# Patient Record
Sex: Female | Born: 1960 | Race: Black or African American | Hispanic: No | Smoking: Never smoker
Health system: Southern US, Community
[De-identification: ages and names within clinical notes are randomized; demographics above are authoritative.]

## PROBLEM LIST (undated history)

## (undated) DIAGNOSIS — R809 Proteinuria, unspecified: Secondary | ICD-10-CM

## (undated) DIAGNOSIS — H35039 Hypertensive retinopathy, unspecified eye: Secondary | ICD-10-CM

## (undated) DIAGNOSIS — H9313 Tinnitus, bilateral: Secondary | ICD-10-CM

## (undated) DIAGNOSIS — E1121 Type 2 diabetes mellitus with diabetic nephropathy: Secondary | ICD-10-CM

## (undated) DIAGNOSIS — J302 Other seasonal allergic rhinitis: Secondary | ICD-10-CM

## (undated) DIAGNOSIS — E119 Type 2 diabetes mellitus without complications: Secondary | ICD-10-CM

## (undated) DIAGNOSIS — K219 Gastro-esophageal reflux disease without esophagitis: Secondary | ICD-10-CM

## (undated) DIAGNOSIS — K279 Peptic ulcer, site unspecified, unspecified as acute or chronic, without hemorrhage or perforation: Secondary | ICD-10-CM

## (undated) DIAGNOSIS — E559 Vitamin D deficiency, unspecified: Secondary | ICD-10-CM

## (undated) DIAGNOSIS — B009 Herpesviral infection, unspecified: Secondary | ICD-10-CM

## (undated) HISTORY — DX: Gastro-esophageal reflux disease without esophagitis: K21.9

## (undated) HISTORY — DX: Herpesviral infection, unspecified: B00.9

## (undated) HISTORY — DX: Hypertensive retinopathy, unspecified eye: H35.039

## (undated) HISTORY — DX: Type 2 diabetes mellitus without complications: E11.9

## (undated) HISTORY — DX: Type 2 diabetes mellitus with diabetic nephropathy: E11.21

## (undated) HISTORY — DX: Peptic ulcer, site unspecified, unspecified as acute or chronic, without hemorrhage or perforation: K27.9

## (undated) HISTORY — DX: Vitamin D deficiency, unspecified: E55.9

## (undated) HISTORY — DX: Other seasonal allergic rhinitis: J30.2

## (undated) HISTORY — DX: Proteinuria, unspecified: R80.9

## (undated) HISTORY — DX: Tinnitus, bilateral: H93.13

---

## 1998-08-13 ENCOUNTER — Ambulatory Visit (HOSPITAL_COMMUNITY): Admission: RE | Admit: 1998-08-13 | Discharge: 1998-08-13 | Payer: Self-pay

## 1999-11-30 ENCOUNTER — Encounter: Admission: RE | Admit: 1999-11-30 | Discharge: 2000-02-28 | Payer: Self-pay | Admitting: Family Medicine

## 2000-09-13 ENCOUNTER — Other Ambulatory Visit: Admission: RE | Admit: 2000-09-13 | Discharge: 2000-09-13 | Payer: Self-pay | Admitting: Obstetrics and Gynecology

## 2001-01-17 ENCOUNTER — Other Ambulatory Visit: Admission: RE | Admit: 2001-01-17 | Discharge: 2001-01-17 | Payer: Self-pay | Admitting: Obstetrics and Gynecology

## 2001-02-06 ENCOUNTER — Ambulatory Visit (HOSPITAL_COMMUNITY): Admission: RE | Admit: 2001-02-06 | Discharge: 2001-02-06 | Payer: Self-pay | Admitting: Obstetrics and Gynecology

## 2001-02-06 ENCOUNTER — Encounter: Payer: Self-pay | Admitting: Obstetrics and Gynecology

## 2001-03-16 ENCOUNTER — Ambulatory Visit (HOSPITAL_COMMUNITY): Admission: RE | Admit: 2001-03-16 | Discharge: 2001-03-16 | Payer: Self-pay | Admitting: Obstetrics and Gynecology

## 2002-08-07 ENCOUNTER — Other Ambulatory Visit: Admission: RE | Admit: 2002-08-07 | Discharge: 2002-08-07 | Payer: Self-pay | Admitting: Obstetrics and Gynecology

## 2002-11-29 ENCOUNTER — Encounter: Payer: Self-pay | Admitting: Obstetrics and Gynecology

## 2002-11-29 ENCOUNTER — Ambulatory Visit (HOSPITAL_COMMUNITY): Admission: RE | Admit: 2002-11-29 | Discharge: 2002-11-29 | Payer: Self-pay | Admitting: Obstetrics and Gynecology

## 2004-03-15 ENCOUNTER — Other Ambulatory Visit: Admission: RE | Admit: 2004-03-15 | Discharge: 2004-03-15 | Payer: Self-pay | Admitting: Obstetrics and Gynecology

## 2004-03-17 ENCOUNTER — Ambulatory Visit (HOSPITAL_COMMUNITY): Admission: RE | Admit: 2004-03-17 | Discharge: 2004-03-17 | Payer: Self-pay | Admitting: Obstetrics and Gynecology

## 2004-12-09 ENCOUNTER — Encounter: Admission: RE | Admit: 2004-12-09 | Discharge: 2004-12-09 | Payer: Self-pay | Admitting: Obstetrics and Gynecology

## 2005-03-22 ENCOUNTER — Other Ambulatory Visit: Admission: RE | Admit: 2005-03-22 | Discharge: 2005-03-22 | Payer: Self-pay | Admitting: Obstetrics and Gynecology

## 2005-04-06 ENCOUNTER — Encounter: Admission: RE | Admit: 2005-04-06 | Discharge: 2005-04-06 | Payer: Self-pay | Admitting: Obstetrics and Gynecology

## 2006-07-12 ENCOUNTER — Other Ambulatory Visit: Admission: RE | Admit: 2006-07-12 | Discharge: 2006-07-12 | Payer: Self-pay | Admitting: Obstetrics and Gynecology

## 2007-07-25 ENCOUNTER — Ambulatory Visit (HOSPITAL_COMMUNITY): Admission: RE | Admit: 2007-07-25 | Discharge: 2007-07-25 | Payer: Self-pay | Admitting: Obstetrics and Gynecology

## 2008-09-02 ENCOUNTER — Ambulatory Visit (HOSPITAL_COMMUNITY): Admission: RE | Admit: 2008-09-02 | Discharge: 2008-09-02 | Payer: Self-pay | Admitting: Obstetrics and Gynecology

## 2008-09-24 ENCOUNTER — Encounter: Admission: RE | Admit: 2008-09-24 | Discharge: 2008-09-24 | Payer: Self-pay | Admitting: Obstetrics and Gynecology

## 2009-01-05 ENCOUNTER — Encounter: Admission: RE | Admit: 2009-01-05 | Discharge: 2009-01-05 | Payer: Self-pay | Admitting: Family Medicine

## 2009-11-18 ENCOUNTER — Encounter: Admission: RE | Admit: 2009-11-18 | Discharge: 2009-11-18 | Payer: Self-pay | Admitting: Obstetrics and Gynecology

## 2009-12-03 ENCOUNTER — Emergency Department (HOSPITAL_COMMUNITY): Admission: EM | Admit: 2009-12-03 | Discharge: 2009-12-03 | Payer: Self-pay | Admitting: Emergency Medicine

## 2010-10-17 ENCOUNTER — Encounter: Payer: Self-pay | Admitting: Obstetrics and Gynecology

## 2011-02-11 ENCOUNTER — Other Ambulatory Visit (HOSPITAL_COMMUNITY): Payer: Self-pay | Admitting: Obstetrics and Gynecology

## 2011-02-11 DIAGNOSIS — Z1231 Encounter for screening mammogram for malignant neoplasm of breast: Secondary | ICD-10-CM

## 2011-02-11 NOTE — H&P (Signed)
[facility]  Patient:    Tammie Drake, Tammie Drake                       MRN: [id number] Adm. Date:  03/16/01 Attending:  Janine Limbo, M.D.                         History and Physical  HISTORY OF PRESENT ILLNESS:   Ms. Cravey is a 50 year old female, para 2-0-3-2, who presents for sterilization.  PAST MEDICAL HISTORY:         The patient has a history of diabetes.  She has a history of peptic ulcers.  There was a question of endometriosis in the past.  DRUG ALLERGIES:               None known.  SOCIAL HISTORY:               The patient denies cigarette use, alcohol use and recreational drug use.  REVIEW OF SYSTEMS:            Noncontributory.  FAMILY HISTORY:               Noncontributory.  PHYSICAL EXAMINATION:  GENERAL:                      Weight is 208 pounds.  Height is 5 feet 1 inch.  HEENT:                        Within normal limits.  CHEST:                        Clear.  HEART:                        Regular rate and rhythm.  BREASTS:                      Without masses.  ABDOMEN:                      Nontender.  EXTREMITIES:                  Within normal limits.  NEUROLOGIC:                   Grossly normal.  PELVIC:                       External genitalia is normal.  The vagina is normal.  Cervix is nontender.  Uterus is normal size, shape and consistency. Adnexa:  No masses.  ASSESSMENT:                   Desires sterilization.  PLAN:                         The patient will undergo a laparoscopic tubal cautery.  She understands the indications for her procedure and she accepts the risks of, but not limited to, anesthetic complications, bleeding, infections, and possible damage to the surrounding organs.  She understands that there are cheaper and safer methods of contraception available including vasectomy.  She understands that there is a small but real failure rate associated with this procedure (10 to 17 per  1000). DD:  03/16/01 TD:  03/16/01 Job:  [id number]

## 2011-02-11 NOTE — Op Note (Signed)
[facility]  Patient:    Tammie Drake, Tammie Drake                     MRN: [id number] Proc. Date: 03/16/01 Adm. Date:  [id number] Attending:  Leonard Schwartz                           Operative Report  PREOPERATIVE DIAGNOSIS:       Desire sterilization.  POSTOPERATIVE DIAGNOSES:      1. Desires sterilization.                               2. Pelvic adhesions.  OPERATION:                    Laparoscopic tubal cautery and lysis of pelvic adhesions.  SURGEON:                      Janine Limbo, M.D.  ANESTHESIA:                   General.  DISPOSITION:                  [name] is a 50 year old female who desires permanent sterilization.  She understand the indications of her procedure, and she accepts the risks of, but not limited, anesthetic complications, bleeding, infections, possibly damage to the surrounding organs, and possible tubal failure 10-17 per 1000).  FINDINGS:                       The uterus, fallopian tubes and ovaries appeared normal.  There were some adhesions in the posterior cul-de-sac between the lower uterine segment and the posterior pelvic cul-de-sac.  DESCRIPTION OF PROCEDURE:     The patient was taken to the operating room where a general anesthetic was given.  The patients abdomen, perineum and vagina were prepped with multiple layers of Betadine.  The bladder was drained of urine.  Examination under anesthesia was performed.  The patient was sterilely draped.  The subumbilical area was injected with 0.5% Marcaine.  An incision was made, and the Verres needle was inserted.  Proper placement was confirmed using the saline drop test.  A pneumoperitoneum was then obtained. The laparoscopic trocar and then the laparoscope were substituted for the Verres needle.  The pelvic structures were visualized with findings as mentioned above.  The right fallopian tube was identified and followed to its fimbriated end.  The  proximal portion of the right fallopian tube was cauterized using the Bovie cautery.  Hemostasis was adequate.  An identical procedure was carried out on the opposite site.  The adhesions in the posterior cul-de-sac were then cauterized and cut.  The remainder of the pelvis appeared normal.  Hemostasis was adequate.  The bowel was inspected, and there was no evidence of trocar damage.  We therefore allowed the pneumoperitoneum to escape.  All instruments were removed.  The incision was closed using deep and superficial sutures of 4-0 Vicryl.  Sponge, needle and instrument counts were correct on two occasions.  The estimated blood loss was less than 5 cc.  The patient tolerated her procedure well.  She was awakened from her anesthetic and taken to the recovery room in stable condition.  FOLLOW-UP INSTRUCTIONS:  The patient will return to see Dr. Stefano Gaul in two  to three weeks for follow-up examination.  She was given a prescription for Tylenol with codeine to take one to two every four hours as needed for pain. She will call for questions or concerns.  She was given a copy of the postoperative instruction sheet as prepared by the [facility] for patients who have undergone diagnostic laparoscopy. DD:  03/16/01 TD:  03/17/01 Job: [id number]

## 2011-02-18 ENCOUNTER — Other Ambulatory Visit: Payer: Self-pay | Admitting: Obstetrics and Gynecology

## 2011-02-18 ENCOUNTER — Ambulatory Visit
Admission: RE | Admit: 2011-02-18 | Discharge: 2011-02-18 | Disposition: A | Discharge status: Home / Self Care | Payer: 59 | Source: Ambulatory Visit | Attending: Obstetrics and Gynecology | Admitting: Obstetrics and Gynecology

## 2011-02-18 ENCOUNTER — Ambulatory Visit (HOSPITAL_COMMUNITY): Payer: 59 | Attending: Obstetrics and Gynecology

## 2011-02-18 DIAGNOSIS — Z1231 Encounter for screening mammogram for malignant neoplasm of breast: Secondary | ICD-10-CM

## 2012-02-06 ENCOUNTER — Other Ambulatory Visit: Payer: Self-pay | Admitting: Obstetrics and Gynecology

## 2012-02-06 DIAGNOSIS — Z1231 Encounter for screening mammogram for malignant neoplasm of breast: Secondary | ICD-10-CM

## 2012-02-24 ENCOUNTER — Ambulatory Visit
Admission: RE | Admit: 2012-02-24 | Discharge: 2012-02-24 | Disposition: A | Discharge status: Home / Self Care | Payer: 59 | Source: Ambulatory Visit | Attending: Obstetrics and Gynecology | Admitting: Obstetrics and Gynecology

## 2012-02-24 DIAGNOSIS — Z1231 Encounter for screening mammogram for malignant neoplasm of breast: Secondary | ICD-10-CM

## 2012-07-30 ENCOUNTER — Telehealth: Payer: Self-pay | Admitting: Obstetrics and Gynecology

## 2012-07-30 ENCOUNTER — Ambulatory Visit (INDEPENDENT_AMBULATORY_CARE_PROVIDER_SITE_OTHER): Payer: 59 | Admitting: Obstetrics and Gynecology

## 2012-07-30 ENCOUNTER — Encounter: Payer: Self-pay | Admitting: Obstetrics and Gynecology

## 2012-07-30 VITALS — BP 110/78 | Ht 61.0 in | Wt 221.0 lb

## 2012-07-30 DIAGNOSIS — N644 Mastodynia: Secondary | ICD-10-CM

## 2012-07-30 NOTE — Telephone Encounter (Signed)
Tc to pt regarding msg.  Pt states has been having issues with her left breast.  Says is noticing a pulling sensation in her breast off/on and has become more noticeable.  Sometimes if she happens to hit it on something, notices the discomfort.  The discomfort is in one localized area near her arm pit.  Pt denies taking any meds at any time for the pain.  Pt requests an eval.  Pt scheduled an appt today w/ AVS @ 1530 for eval.

## 2012-07-30 NOTE — Progress Notes (Signed)
HISTORY OF PRESENT ILLNESS  [name] is a 51 y.o. year old female,No obstetric history on file., who presents for a problem visit. The patient had a normal mammogram in May 2013.  Subjective:  The patient complains of a vague discomfort in the left upper outer breast.  She thinks that her left breast is swollen.  She denies bleeding and discharge from the breast.  She denies trauma to the breast.  Objective:  BP 110/78  Ht 5\' 1"  (1.549 m)  Wt 221 lb (100.245 kg)  BMI 41.76 kg/m2   breast exam: Large breasts but no masses, skin changes, or abnormal lesions.  No discharge or bleeding from the nipple.  Exam deferred.  Assessment:  Pain in the left breast  Plan:  Possible etiologies for breast pain were reviewed.  Questions were answered.  The patient will take vitamin E to see if her breast pain improves.  If there is no improvement in 4 weeks then we will obtain an ultrasound of the breast.  Return to office in 4 week(s).   Leonard Schwartz M.D.  07/30/2012 7:45 PM

## 2012-10-25 ENCOUNTER — Ambulatory Visit: Payer: 59 | Admitting: Obstetrics and Gynecology

## 2016-08-09 ENCOUNTER — Telehealth: Payer: Self-pay | Admitting: *Deleted

## 2016-08-09 NOTE — Telephone Encounter (Signed)
NOTES SENT TO SCHEDULING ON 08/09/16. °

## 2016-10-06 DIAGNOSIS — H9313 Tinnitus, bilateral: Secondary | ICD-10-CM | POA: Diagnosis not present

## 2016-10-25 DIAGNOSIS — H9312 Tinnitus, left ear: Secondary | ICD-10-CM | POA: Diagnosis not present

## 2016-10-25 DIAGNOSIS — H9042 Sensorineural hearing loss, unilateral, left ear, with unrestricted hearing on the contralateral side: Secondary | ICD-10-CM | POA: Diagnosis not present

## 2016-11-08 ENCOUNTER — Other Ambulatory Visit: Payer: Self-pay | Admitting: Family Medicine

## 2016-11-08 ENCOUNTER — Ambulatory Visit
Admission: RE | Admit: 2016-11-08 | Discharge: 2016-11-08 | Disposition: A | Discharge status: Home / Self Care | Payer: 59 | Source: Ambulatory Visit | Attending: Family Medicine | Admitting: Family Medicine

## 2016-11-08 DIAGNOSIS — Z1231 Encounter for screening mammogram for malignant neoplasm of breast: Secondary | ICD-10-CM

## 2016-11-28 DIAGNOSIS — K219 Gastro-esophageal reflux disease without esophagitis: Secondary | ICD-10-CM | POA: Diagnosis not present

## 2016-11-28 DIAGNOSIS — E559 Vitamin D deficiency, unspecified: Secondary | ICD-10-CM | POA: Diagnosis not present

## 2016-11-28 DIAGNOSIS — E1165 Type 2 diabetes mellitus with hyperglycemia: Secondary | ICD-10-CM | POA: Diagnosis not present

## 2016-12-19 DIAGNOSIS — R300 Dysuria: Secondary | ICD-10-CM | POA: Diagnosis not present

## 2016-12-19 DIAGNOSIS — N3000 Acute cystitis without hematuria: Secondary | ICD-10-CM | POA: Diagnosis not present

## 2017-01-12 ENCOUNTER — Telehealth: Payer: Self-pay | Admitting: *Deleted

## 2017-01-12 NOTE — Telephone Encounter (Signed)
NOTES SENT TO SCHEDULING.  °

## 2017-01-16 ENCOUNTER — Telehealth: Payer: Self-pay | Admitting: Cardiovascular Disease

## 2017-01-16 NOTE — Telephone Encounter (Signed)
Received records from [facility] for appointment on 01/23/17 with Dr Tresa Endo.  Records put with Dr Landry Dyke schedule for 01/23/17. lp

## 2017-01-23 ENCOUNTER — Ambulatory Visit: Payer: 59 | Admitting: Cardiovascular Disease

## 2017-01-26 ENCOUNTER — Encounter: Payer: Self-pay | Admitting: Cardiology

## 2017-01-30 ENCOUNTER — Encounter: Payer: Self-pay | Admitting: Cardiology

## 2017-01-30 ENCOUNTER — Ambulatory Visit (INDEPENDENT_AMBULATORY_CARE_PROVIDER_SITE_OTHER): Payer: 59 | Admitting: Cardiology

## 2017-01-30 DIAGNOSIS — R0609 Other forms of dyspnea: Secondary | ICD-10-CM | POA: Insufficient documentation

## 2017-01-30 DIAGNOSIS — R0600 Dyspnea, unspecified: Secondary | ICD-10-CM

## 2017-01-30 DIAGNOSIS — E118 Type 2 diabetes mellitus with unspecified complications: Secondary | ICD-10-CM | POA: Diagnosis not present

## 2017-01-30 DIAGNOSIS — R079 Chest pain, unspecified: Secondary | ICD-10-CM

## 2017-01-30 NOTE — Patient Instructions (Addendum)
Schedule at  [facility] Your physician has requested that you have cardiac CT. Cardiac computed tomography (CT) is a painless test that uses an x-ray machine to take clear, detailed pictures of your heart. For further information please visit [email or web address]. Please follow instruction sheet as given.   Your physician recommends that you schedule a follow-up appointment in 2 months with DR HARDING.

## 2017-01-30 NOTE — Progress Notes (Signed)
PCP: Johny Blamer, MD  Clinic Note: Chief Complaint  Patient presents with  . New Patient (Initial Visit)    chest pain-had one episode in the past and wanted to follow up with cardiologist     HPI: Tammie Drake is a 56 y.o. female who is being seen today for the evaluation of Chest pain with cardiac risk factors at the request of Johny Blamer, MD. - Last notes available from PCP were from November 2017: At that time she had an episode of chest pain and was referred for cardiology visit. She did not really have it scheduled. She now presents for planned consultation.  Recent Hospitalizations: None  Studies Personally Reviewed - if available, images/films reviewed: From Epic Chart or Care Everywhere   EKG from PCPs office November 2017: Sinus rhythm, rate 63 BPM. Normal axis, intervals and durations. Normal EKG.  Interval History: Yalonda presents today to discuss a distant history of chest pain but also notes some exertional dyspnea. She has difficult control diabetes. Over the past for 5 months she notes every now and then having a discomfort across her upper chest. Episodes can last anywhere from a few seconds to a few minutes. Usually they occur at rest, but does not worsen with exertion. She is also noticing left arm numbness and angling in the fingers. This will occasionally then feel like a squeezing pain every now and then in her left arm. But this is occurred since she started lifting weights.  Her chest discomfort episodes are not associated with anything like dyspnea or palpitations, nausea or vomiting. Although she has not had any of the chest discomfort with exertion she does note exertional dyspnea, especially when climbing stairs. However she is able to go to the gym where she does walking light weights and cardio without any exertional chest tightness and significant dyspnea. Only when she is trying to make herself short of breath will she get short of breath.  She  really hasn't had that many significant chest pain episodes since the one back in November 2017. That was a more prolonged episode, but she doesn't remember much of the details. She denies any heart failure symptoms of PND, orthopnea or edema.   No palpitations, lightheadedness, dizziness, weakness or syncope/near syncope. No TIA/amaurosis fugax symptoms. No claudication.  She notes that she is trying to do better with her diabetic control with diet and medication management. It has been quite difficult control.  ROS: A comprehensive was performed. Review of Systems  Constitutional: Negative for malaise/fatigue.  HENT: Negative for congestion and nosebleeds.   Respiratory: Negative for cough and wheezing.   Gastrointestinal: Negative for blood in stool and melena.  Genitourinary: Negative for hematuria.  Musculoskeletal: Negative for joint pain.  Skin: Negative.   Neurological: Negative for dizziness and focal weakness.  Endo/Heme/Allergies: Negative for environmental allergies.  Psychiatric/Behavioral: Negative.   All other systems reviewed and are negative.  I have reviewed and (if needed) personally updated the patient's problem list, medications, allergies, past medical and surgical history, social and family history.   Past Medical History:  Diagnosis Date  . Diabetic nephropathy with proteinuria (HCC)   . GERD (gastroesophageal reflux disease)   . Herpes simplex disease   . Peptic ulcer disease   . Type 2 diabetes mellitus (HCC)    Difficult control. Last hemoglobin A1c 11.4 (November 2017)  . Vitamin D deficiency     No past surgical history on file.  Current Meds  Medication Sig  .  acetaminophen (TYLENOL) 325 MG tablet Take 650 mg by mouth every 6 (six) hours as needed.  . Blood Glucose Monitoring Suppl (ACCU-CHEK AVIVA) device by Other route. Use as instructed  . cholecalciferol (VITAMIN D) 1000 UNITS tablet Take 1,000 Units by mouth daily.  Marland Kitchen. glimepiride (AMARYL)  4 MG tablet Take 4 mg by mouth daily with breakfast.  . glucose blood test strip 1 each by Other route as needed for other. Use as instructed  . linagliptin (TRADJENTA) 5 MG TABS tablet Take 5 mg by mouth daily.  . naproxen sodium (ANAPROX) 550 MG tablet Take 550 mg by mouth 2 (two) times daily with a meal.  . omeprazole (PRILOSEC) 20 MG capsule Take 20 mg by mouth daily as needed.  . triamcinolone cream (KENALOG) 0.1 % Apply 1 application topically 2 (two) times daily as needed.    Allergies  Allergen Reactions  . Ciprofloxacin     HYPOGLYCEMIA  . Metformin And Related     LACK OF THERAPEUTIC EFFECT  . Sulfa Antibiotics Hives  . Tagamet [Cimetidine] Hives    Social History   Social History  . Marital status: Legally Separated    Spouse name: N/A  . Number of children: 2  . Years of education: N/A   Occupational History  . CASE WORKER    Social History Main Topics  . Smoking status: Never Smoker  . Smokeless tobacco: Never Used  . Alcohol use Yes  . Drug use: No  . Sexual activity: No   Other Topics Concern  . None   Social History Narrative  . None    family history includes Hypertension in her mother.  Wt Readings from Last 3 Encounters:  01/30/17 208 lb (94.3 kg)  07/30/12 221 lb (100.2 kg)    PHYSICAL EXAM BP 140/76 (BP Location: Left Arm, Patient Position: Sitting, Cuff Size: Large)   Pulse 64   Ht 5\' 1"  (1.549 m)   Wt 208 lb (94.3 kg)   BMI 39.30 kg/m  General appearance: alert, cooperative, appears stated age, no distress and Moderately severely obese. Well-nourished and well-groomed however. Neck: no adenopathy, no carotid bruit and no JVD Lungs: clear to auscultation bilaterally, normal percussion bilaterally and non-labored Heart: regular rate and rhythm, S1& S2 normal, no murmur, click, rub or gallop; non-displaced PMI Abdomen: soft, non-tender; bowel sounds normal; no masses,  no organomegaly; no HJR Extremities: extremities normal,  atraumatic, no cyanosis, and edema - trivial Pulses: 2+ and symmetric;  Skin: mobility and turgor normal, no evidence of bleeding or bruising, no lesions noted, temperature normal and texture normal Neurologic: Mental status: Alert, oriented, thought content appropriate; pleasant mood and affect. Grossly normal. Cranial nerves: normal (II-XII grossly intact)    Adult ECG Report  Rate: 65 ;  Rhythm: normal sinus rhythm and Normal axis, intervals and durations;   Narrative Interpretation: Normal EKG   Other studies Reviewed: Additional studies/ records that were reviewed today include:  Recent Labs:  - Lab sheet scanned from November 2017: Pertinent labs noted below   Hemoglobin A1c 11.4, Glucose 263, BUN/creatinine 10/0.79. LFTs and chemistries are otherwise normal.    TC 139, TG 81, LDL 78, HDL 45  ASSESSMENT / PLAN: Problem List Items Addressed This Visit    Chest pain with moderate risk for cardiac etiology    [place] details of when she had a prolonged episode of chest pain, but has now had some intermittent chest discomfort at rest but then exertional dyspnea. She may not be exerting  herself to the extent to which she may notice angina for now. She has elevated blood pressure here without diagnosis of hypertension, she has poorly controlled diabetes.  Would like to exclude ischemic CAD but would like an anatomic evaluation as well. Plan: Coronary CTA with calcium score and possible CT FFR. (We will have to wait until July to have this done (she has been relatively asymptomatic, we can wait)      Relevant Orders   EKG 12-Lead (Completed)   CT CORONARY MORPH W/CTA COR W/SCORE W/CA W/CM &/OR WO/CM   CT CORONARY FRACTIONAL FLOW RESERVE DATA PREP   CT CORONARY FRACTIONAL FLOW RESERVE FLUID ANALYSIS   Exertional dyspnea    This is really her more notable symptom being exertional dyspnea going up stairs. With her wanting to get more involved in her size regimen, she would like to  make sure that she is fine from a cardiac standpoint. We talked about doing a GXT (excess stress test), but this has low diagnostic yield. Other options would be Myoview or stress echo, however in the area just of an anatomic evaluation, I have chosen coronary CTA.      Relevant Orders   EKG 12-Lead (Completed)   CT CORONARY MORPH W/CTA COR W/SCORE W/CA W/CM &/OR WO/CM   CT CORONARY FRACTIONAL FLOW RESERVE DATA PREP   CT CORONARY FRACTIONAL FLOW RESERVE FLUID ANALYSIS   Type 2 diabetes mellitus with complication (HCC) (Chronic)    Hemoglobin A1c was 11.4. This level of hyperglycemia is definitely concerning for coronary disease. I would like to determine the extent of CAD in order to determine whether or not we need to be more aggressive with lipid control as well as glycemic control with potential use of insulin.  Plan: Coronary CTA         Current medicines are reviewed at length with the patient today. (+/- concerns) n/a The following changes have been made: n/a  Patient Instructions  Schedule at  [facility] [address] Your physician has requested that you have cardiac CT. Cardiac computed tomography (CT) is a painless test that uses an x-ray machine to take clear, detailed pictures of your heart. For further information please visit [email or web address] Please follow instruction sheet as given.   Your physician recommends that you schedule a follow-up appointment in 2 months with DR HARDING.    Studies Ordered:   Orders Placed This Encounter  Procedures  . CT CORONARY MORPH W/CTA COR W/SCORE W/CA W/CM &/OR WO/CM  . CT CORONARY FRACTIONAL FLOW RESERVE DATA PREP  . CT CORONARY FRACTIONAL FLOW RESERVE FLUID ANALYSIS  . EKG 12-Lead      Bryan Lemma, M.D., M.S. Interventional Cardiologist   Pager # [phone] Phone # [phone] [address] [place], [place] [address]

## 2017-02-06 ENCOUNTER — Encounter: Payer: Self-pay | Admitting: Cardiology

## 2017-02-06 DIAGNOSIS — E118 Type 2 diabetes mellitus with unspecified complications: Secondary | ICD-10-CM | POA: Insufficient documentation

## 2017-02-06 NOTE — Assessment & Plan Note (Signed)
Hemoglobin A1c was 11.4. This level of hyperglycemia is definitely concerning for coronary disease. I would like to determine the extent of CAD in order to determine whether or not we need to be more aggressive with lipid control as well as glycemic control with potential use of insulin.  Plan: Coronary CTA

## 2017-02-06 NOTE — Assessment & Plan Note (Signed)
Hartfelder details of when she had a prolonged episode of chest pain, but has now had some intermittent chest discomfort at rest but then exertional dyspnea. She may not be exerting herself to the extent to which she may notice angina for now. She has elevated blood pressure here without diagnosis of hypertension, she has poorly controlled diabetes.  Would like to exclude ischemic CAD but would like an anatomic evaluation as well. Plan: Coronary CTA with calcium score and possible CT FFR. (We will have to wait until July to have this done (she has been relatively asymptomatic, we can wait)

## 2017-02-06 NOTE — Assessment & Plan Note (Signed)
This is really her more notable symptom being exertional dyspnea going up stairs. With her wanting to get more involved in her size regimen, she would like to make sure that she is fine from a cardiac standpoint. We talked about doing a GXT (excess stress test), but this has low diagnostic yield. Other options would be Myoview or stress echo, however in the area just of an anatomic evaluation, I have chosen coronary CTA.

## 2017-03-13 DIAGNOSIS — H40019 Open angle with borderline findings, low risk, unspecified eye: Secondary | ICD-10-CM | POA: Diagnosis not present

## 2017-03-13 DIAGNOSIS — H2513 Age-related nuclear cataract, bilateral: Secondary | ICD-10-CM | POA: Diagnosis not present

## 2017-03-13 DIAGNOSIS — E113293 Type 2 diabetes mellitus with mild nonproliferative diabetic retinopathy without macular edema, bilateral: Secondary | ICD-10-CM | POA: Diagnosis not present

## 2017-03-30 DIAGNOSIS — E1165 Type 2 diabetes mellitus with hyperglycemia: Secondary | ICD-10-CM | POA: Diagnosis not present

## 2017-03-30 DIAGNOSIS — K219 Gastro-esophageal reflux disease without esophagitis: Secondary | ICD-10-CM | POA: Diagnosis not present

## 2017-03-30 DIAGNOSIS — E559 Vitamin D deficiency, unspecified: Secondary | ICD-10-CM | POA: Diagnosis not present

## 2017-04-27 ENCOUNTER — Ambulatory Visit: Payer: 59 | Admitting: Cardiology

## 2017-04-28 DIAGNOSIS — N3000 Acute cystitis without hematuria: Secondary | ICD-10-CM | POA: Diagnosis not present

## 2017-04-28 DIAGNOSIS — R300 Dysuria: Secondary | ICD-10-CM | POA: Diagnosis not present

## 2017-04-28 DIAGNOSIS — E1165 Type 2 diabetes mellitus with hyperglycemia: Secondary | ICD-10-CM | POA: Diagnosis not present

## 2017-05-19 ENCOUNTER — Encounter: Payer: Self-pay | Admitting: Cardiovascular Disease

## 2017-05-23 DIAGNOSIS — H9312 Tinnitus, left ear: Secondary | ICD-10-CM | POA: Diagnosis not present

## 2017-07-04 DIAGNOSIS — E1165 Type 2 diabetes mellitus with hyperglycemia: Secondary | ICD-10-CM | POA: Diagnosis not present

## 2017-07-04 DIAGNOSIS — E559 Vitamin D deficiency, unspecified: Secondary | ICD-10-CM | POA: Diagnosis not present

## 2017-08-22 DIAGNOSIS — J0100 Acute maxillary sinusitis, unspecified: Secondary | ICD-10-CM | POA: Diagnosis not present

## 2017-08-22 DIAGNOSIS — R42 Dizziness and giddiness: Secondary | ICD-10-CM | POA: Diagnosis not present

## 2017-09-27 DIAGNOSIS — J069 Acute upper respiratory infection, unspecified: Secondary | ICD-10-CM | POA: Diagnosis not present

## 2017-10-31 DIAGNOSIS — H5203 Hypermetropia, bilateral: Secondary | ICD-10-CM | POA: Diagnosis not present

## 2017-10-31 DIAGNOSIS — H524 Presbyopia: Secondary | ICD-10-CM | POA: Diagnosis not present

## 2017-11-07 DIAGNOSIS — E1165 Type 2 diabetes mellitus with hyperglycemia: Secondary | ICD-10-CM | POA: Diagnosis not present

## 2017-11-07 DIAGNOSIS — J0100 Acute maxillary sinusitis, unspecified: Secondary | ICD-10-CM | POA: Diagnosis not present

## 2017-11-07 DIAGNOSIS — R350 Frequency of micturition: Secondary | ICD-10-CM | POA: Diagnosis not present

## 2017-11-30 DIAGNOSIS — H3582 Retinal ischemia: Secondary | ICD-10-CM | POA: Diagnosis not present

## 2017-11-30 DIAGNOSIS — E113391 Type 2 diabetes mellitus with moderate nonproliferative diabetic retinopathy without macular edema, right eye: Secondary | ICD-10-CM | POA: Diagnosis not present

## 2017-11-30 DIAGNOSIS — E113312 Type 2 diabetes mellitus with moderate nonproliferative diabetic retinopathy with macular edema, left eye: Secondary | ICD-10-CM | POA: Diagnosis not present

## 2018-02-16 DIAGNOSIS — E1165 Type 2 diabetes mellitus with hyperglycemia: Secondary | ICD-10-CM | POA: Diagnosis not present

## 2018-02-16 DIAGNOSIS — E559 Vitamin D deficiency, unspecified: Secondary | ICD-10-CM | POA: Diagnosis not present

## 2018-04-16 DIAGNOSIS — H3582 Retinal ischemia: Secondary | ICD-10-CM | POA: Diagnosis not present

## 2018-04-16 DIAGNOSIS — E113312 Type 2 diabetes mellitus with moderate nonproliferative diabetic retinopathy with macular edema, left eye: Secondary | ICD-10-CM | POA: Diagnosis not present

## 2018-04-16 DIAGNOSIS — E113391 Type 2 diabetes mellitus with moderate nonproliferative diabetic retinopathy without macular edema, right eye: Secondary | ICD-10-CM | POA: Diagnosis not present

## 2018-06-11 ENCOUNTER — Other Ambulatory Visit: Payer: Self-pay | Admitting: Family Medicine

## 2018-06-11 ENCOUNTER — Ambulatory Visit
Admission: RE | Admit: 2018-06-11 | Discharge: 2018-06-11 | Disposition: A | Discharge status: Home / Self Care | Payer: 59 | Source: Ambulatory Visit | Attending: Family Medicine | Admitting: Family Medicine

## 2018-06-11 DIAGNOSIS — M25462 Effusion, left knee: Secondary | ICD-10-CM | POA: Diagnosis not present

## 2018-06-11 DIAGNOSIS — M25562 Pain in left knee: Secondary | ICD-10-CM

## 2018-06-11 DIAGNOSIS — E1165 Type 2 diabetes mellitus with hyperglycemia: Secondary | ICD-10-CM | POA: Diagnosis not present

## 2018-07-26 DIAGNOSIS — K219 Gastro-esophageal reflux disease without esophagitis: Secondary | ICD-10-CM | POA: Diagnosis not present

## 2018-07-26 DIAGNOSIS — E1165 Type 2 diabetes mellitus with hyperglycemia: Secondary | ICD-10-CM | POA: Diagnosis not present

## 2018-07-26 DIAGNOSIS — E559 Vitamin D deficiency, unspecified: Secondary | ICD-10-CM | POA: Diagnosis not present

## 2018-08-07 DIAGNOSIS — M25562 Pain in left knee: Secondary | ICD-10-CM | POA: Diagnosis not present

## 2018-08-07 DIAGNOSIS — M25571 Pain in right ankle and joints of right foot: Secondary | ICD-10-CM | POA: Diagnosis not present

## 2018-08-29 DIAGNOSIS — J069 Acute upper respiratory infection, unspecified: Secondary | ICD-10-CM | POA: Diagnosis not present

## 2018-09-04 DIAGNOSIS — M25562 Pain in left knee: Secondary | ICD-10-CM | POA: Diagnosis not present

## 2018-09-04 DIAGNOSIS — M25571 Pain in right ankle and joints of right foot: Secondary | ICD-10-CM | POA: Diagnosis not present

## 2018-10-18 DIAGNOSIS — H3582 Retinal ischemia: Secondary | ICD-10-CM | POA: Diagnosis not present

## 2018-10-18 DIAGNOSIS — E113312 Type 2 diabetes mellitus with moderate nonproliferative diabetic retinopathy with macular edema, left eye: Secondary | ICD-10-CM | POA: Diagnosis not present

## 2018-10-18 DIAGNOSIS — E113391 Type 2 diabetes mellitus with moderate nonproliferative diabetic retinopathy without macular edema, right eye: Secondary | ICD-10-CM | POA: Diagnosis not present

## 2018-10-31 DIAGNOSIS — K219 Gastro-esophageal reflux disease without esophagitis: Secondary | ICD-10-CM | POA: Diagnosis not present

## 2018-10-31 DIAGNOSIS — E113312 Type 2 diabetes mellitus with moderate nonproliferative diabetic retinopathy with macular edema, left eye: Secondary | ICD-10-CM | POA: Diagnosis not present

## 2018-10-31 DIAGNOSIS — E1165 Type 2 diabetes mellitus with hyperglycemia: Secondary | ICD-10-CM | POA: Diagnosis not present

## 2018-11-07 DIAGNOSIS — J209 Acute bronchitis, unspecified: Secondary | ICD-10-CM | POA: Diagnosis not present

## 2018-11-29 DIAGNOSIS — E113312 Type 2 diabetes mellitus with moderate nonproliferative diabetic retinopathy with macular edema, left eye: Secondary | ICD-10-CM | POA: Diagnosis not present

## 2019-09-02 ENCOUNTER — Other Ambulatory Visit: Payer: Self-pay | Admitting: Family Medicine

## 2019-09-02 DIAGNOSIS — Z1231 Encounter for screening mammogram for malignant neoplasm of breast: Secondary | ICD-10-CM

## 2019-09-04 ENCOUNTER — Other Ambulatory Visit: Payer: Self-pay | Admitting: Family Medicine

## 2019-09-04 DIAGNOSIS — R1012 Left upper quadrant pain: Secondary | ICD-10-CM

## 2019-09-16 ENCOUNTER — Ambulatory Visit
Admission: RE | Admit: 2019-09-16 | Discharge: 2019-09-16 | Disposition: A | Discharge status: Home / Self Care | Payer: 59 | Source: Ambulatory Visit | Attending: Family Medicine | Admitting: Family Medicine

## 2019-09-16 DIAGNOSIS — R1012 Left upper quadrant pain: Secondary | ICD-10-CM

## 2019-09-24 ENCOUNTER — Ambulatory Visit
Admission: RE | Admit: 2019-09-24 | Discharge: 2019-09-24 | Disposition: A | Discharge status: Home / Self Care | Payer: 59 | Source: Ambulatory Visit | Attending: Family Medicine | Admitting: Family Medicine

## 2019-09-24 ENCOUNTER — Other Ambulatory Visit: Payer: Self-pay | Admitting: Family Medicine

## 2019-09-24 DIAGNOSIS — R0781 Pleurodynia: Secondary | ICD-10-CM

## 2019-10-19 ENCOUNTER — Other Ambulatory Visit: Payer: Self-pay | Admitting: Family Medicine

## 2019-10-19 DIAGNOSIS — R1012 Left upper quadrant pain: Secondary | ICD-10-CM

## 2019-10-22 ENCOUNTER — Other Ambulatory Visit: Payer: Self-pay | Admitting: Family Medicine

## 2019-10-25 ENCOUNTER — Ambulatory Visit
Admission: RE | Admit: 2019-10-25 | Discharge: 2019-10-25 | Disposition: A | Discharge status: Home / Self Care | Payer: 59 | Source: Ambulatory Visit | Attending: Family Medicine | Admitting: Family Medicine

## 2019-10-25 ENCOUNTER — Other Ambulatory Visit: Payer: Self-pay

## 2019-10-25 DIAGNOSIS — R1012 Left upper quadrant pain: Secondary | ICD-10-CM

## 2019-10-25 MED ORDER — IOPAMIDOL (ISOVUE-300) INJECTION 61%
100.0000 mL | Freq: Once | INTRAVENOUS | Status: AC | PRN
  Administered 2019-10-25: 15:00:00 100 mL via INTRAVENOUS

## 2019-11-19 ENCOUNTER — Other Ambulatory Visit: Payer: Self-pay | Admitting: Family Medicine

## 2019-11-19 DIAGNOSIS — R0789 Other chest pain: Secondary | ICD-10-CM

## 2019-11-29 ENCOUNTER — Ambulatory Visit
Admission: RE | Admit: 2019-11-29 | Discharge: 2019-11-29 | Disposition: A | Discharge status: Home / Self Care | Payer: 59 | Source: Ambulatory Visit | Attending: Family Medicine | Admitting: Family Medicine

## 2019-11-29 ENCOUNTER — Other Ambulatory Visit: Payer: Self-pay

## 2019-11-29 DIAGNOSIS — R0789 Other chest pain: Secondary | ICD-10-CM

## 2019-11-29 MED ORDER — IOPAMIDOL (ISOVUE-300) INJECTION 61%
75.0000 mL | Freq: Once | INTRAVENOUS | Status: AC | PRN
  Administered 2019-11-29: 75 mL via INTRAVENOUS

## 2020-01-28 ENCOUNTER — Other Ambulatory Visit: Payer: Self-pay | Admitting: Family Medicine

## 2020-01-28 DIAGNOSIS — Z1231 Encounter for screening mammogram for malignant neoplasm of breast: Secondary | ICD-10-CM

## 2020-02-05 ENCOUNTER — Ambulatory Visit: Payer: 59

## 2020-02-06 ENCOUNTER — Other Ambulatory Visit: Payer: Self-pay

## 2020-02-06 ENCOUNTER — Ambulatory Visit
Admission: RE | Admit: 2020-02-06 | Discharge: 2020-02-06 | Disposition: A | Discharge status: Home / Self Care | Payer: 59 | Source: Ambulatory Visit | Attending: Family Medicine | Admitting: Family Medicine

## 2020-02-06 ENCOUNTER — Ambulatory Visit: Payer: 59

## 2020-02-06 DIAGNOSIS — Z1231 Encounter for screening mammogram for malignant neoplasm of breast: Secondary | ICD-10-CM

## 2021-03-08 ENCOUNTER — Other Ambulatory Visit: Payer: Self-pay | Admitting: Family Medicine

## 2021-03-08 ENCOUNTER — Other Ambulatory Visit: Payer: Self-pay | Admitting: *Deleted

## 2021-03-08 DIAGNOSIS — Z1231 Encounter for screening mammogram for malignant neoplasm of breast: Secondary | ICD-10-CM

## 2021-03-17 ENCOUNTER — Ambulatory Visit
Admission: RE | Admit: 2021-03-17 | Discharge: 2021-03-17 | Disposition: A | Discharge status: Home / Self Care | Payer: 59 | Source: Ambulatory Visit | Attending: Family Medicine | Admitting: Family Medicine

## 2021-03-17 ENCOUNTER — Other Ambulatory Visit: Payer: Self-pay

## 2021-03-17 DIAGNOSIS — Z1231 Encounter for screening mammogram for malignant neoplasm of breast: Secondary | ICD-10-CM

## 2022-09-01 ENCOUNTER — Encounter: Payer: Self-pay | Admitting: Cardiology

## 2022-09-01 ENCOUNTER — Ambulatory Visit: Payer: 59 | Attending: Cardiology | Admitting: Cardiology

## 2022-09-01 VITALS — BP 130/70 | HR 66 | Ht 61.0 in | Wt 204.0 lb

## 2022-09-01 DIAGNOSIS — E118 Type 2 diabetes mellitus with unspecified complications: Secondary | ICD-10-CM | POA: Diagnosis not present

## 2022-09-01 DIAGNOSIS — R079 Chest pain, unspecified: Secondary | ICD-10-CM | POA: Diagnosis not present

## 2022-09-01 DIAGNOSIS — R0609 Other forms of dyspnea: Secondary | ICD-10-CM | POA: Diagnosis not present

## 2022-09-01 MED ORDER — METOPROLOL TARTRATE 50 MG PO TABS: 50.0000 mg | ORAL_TABLET | ORAL | 0 refills | Status: AC

## 2022-09-01 NOTE — Patient Instructions (Signed)
Medication Instructions:  The current medical regimen is effective;  continue present plan and medications.  *If you need a refill on your cardiac medications before your next appointment, please call your pharmacy*   Testing/Procedures: Your physician has requested that you have an echocardiogram. Echocardiography is a painless test that uses sound waves to create images of your heart. It provides your doctor with information about the size and shape of your heart and how well your heart's chambers and valves are working. This procedure takes approximately one hour. There are no restrictions for this procedure. Please do NOT wear cologne, perfume, aftershave, or lotions (deodorant is allowed). Please arrive 15 minutes prior to your appointment time.    Your cardiac CT will be scheduled at:   [facility] [address] [place], [place] [address] [phone]  Please arrive at the [facility] and Children's Entrance (Entrance C2) of [facility] 30 minutes prior to test start time. You can use the FREE valet parking offered at entrance C (encouraged to control the heart rate for the test)  Proceed to the [facility] Radiology Department (first floor) to check-in and test prep.  All radiology patients and guests should use entrance C2 at [facility], accessed from [facility], even though the hospital's physical address listed is [address]     Please follow these instructions carefully (unless otherwise directed):  On the Night Before the Test: Be sure to Drink plenty of water. Do not consume any caffeinated/decaffeinated beverages or chocolate 12 hours prior to your test. Do not take any antihistamines 12 hours prior to your test.  On the Day of the Test: Drink plenty of water until 1 hour prior to the test. Do not eat any food 1 hour prior to test. You may take your regular medications prior to the test.  Take metoprolol  (Lopressor) two hours prior to test. HOLD Furosemide/Hydrochlorothiazide morning of the test. FEMALES- please wear underwire-free bra if available, avoid dresses & tight clothing  After the Test: Drink plenty of water. After receiving IV contrast, you may experience a mild flushed feeling. This is normal. On occasion, you may experience a mild rash up to 24 hours after the test. This is not dangerous. If this occurs, you can take Benadryl 25 mg and increase your fluid intake. If you experience trouble breathing, this can be serious. If it is severe call 911 IMMEDIATELY. If it is mild, please call our office. If you take any of these medications: Glipizide/Metformin, Avandament, Glucavance, please do not take 48 hours after completing test unless otherwise instructed.  We will call to schedule your test 2-4 weeks out understanding that some insurance companies will need an authorization prior to the service being performed.   For non-scheduling related questions, please contact the cardiac imaging nurse navigator should you have any questions/concerns: Rockwell [place], Cardiac Imaging Nurse Navigator Larey Brick, Cardiac Imaging Nurse Navigator [facility] Direct Office Dial: [phone]   For scheduling needs, including cancellations and rescheduling, please call [place], [phone].   Follow-Up: At [facility], you and your health needs are our priority.  As part of our continuing mission to provide you with exceptional heart care, we have created designated Provider Care Teams.  These Care Teams include your primary Cardiologist (physician) and Advanced Practice Providers (APPs -  Physician Assistants and Nurse Practitioners) who all work together to provide you with the care you need, when you need it.  We recommend signing up for the patient portal called "MyChart".  Sign up information is provided on this After Visit Summary.  MyChart is used to  connect with patients for Virtual Visits (Telemedicine).  Patients are able to view lab/test results, encounter notes, upcoming appointments, etc.  Non-urgent messages can be sent to your provider as well.   To learn more about what you can do with MyChart, go to [email or web address].    Your next appointment:   Follow up will be based on the results of the above testing.  Important Information About Sugar

## 2022-09-01 NOTE — Progress Notes (Signed)
Cardiology Office Note:    Date:  09/01/2022   ID:  Tammie Drake, DOB [birth year], MRN [id number]  PCP:  Johny Blamer, MD   [facility] Providers Cardiologist:  Donato Schultz, MD     Referring MD: Johny Blamer, MD    History of Present Illness:    Tammie Drake is a 61 y.o. female here for the evaluation of shortness of breath with exertion possible anginal equivalent, palpitations at the request of Dr. Tiburcio Pea.  Has diabetes.  In review of prior notes she was noticing intermittent symptoms for about 6 months when noticing shortness of breath with activity like dancing or going up and down stairs.  Will take a few minutes after the shortness of breath starts to rest and go away.  Denies any recent fevers chills cough wheezing.  She does have a prior history of asthma however.  Had some palpitations as well after doing line dancing.  This was the first time she felt shortness of breath and palpitations at the same time.  Is been many years since she has had a stress test.  Previously she was told that some skips were related to caffeine so she switched to decaf coffee.  She had been consuming more chocolate recently.  TSH was 1.18 from outside labs, hemoglobin 12.2 potassium 4.9 creatinine 1.03  Works for [employer] for over 20 years.  Past Medical History:  Diagnosis Date   Bilateral tinnitus    Diabetic nephropathy with proteinuria (HCC)    GERD (gastroesophageal reflux disease)    Herpes simplex    Herpes simplex disease    Hypertensive retinopathy    Peptic ulcer    Peptic ulcer disease    Proteinuria    Seasonal allergies    Type 2 diabetes mellitus (HCC)    Difficult control. Last hemoglobin A1c 11.4 (November 2017)   Vitamin D deficiency     No past surgical history on file.  Current Medications: Current Meds  Medication Sig   acetaminophen (TYLENOL) 325 MG tablet Take 650 mg by mouth every 6 (six) hours as needed.   Blood Glucose Monitoring  Suppl (ACCU-CHEK AVIVA) device by Other route. Use as instructed   cholecalciferol (VITAMIN D) 1000 UNITS tablet Take 1,000 Units by mouth daily.   glimepiride (AMARYL) 4 MG tablet Take 4 mg by mouth daily with breakfast.   glucose blood test strip 1 each by Other route as needed for other. Use as instructed   linagliptin (TRADJENTA) 5 MG TABS tablet Take 5 mg by mouth daily.   metoprolol tartrate (LOPRESSOR) 50 MG tablet Take 1 tablet (50 mg total) by mouth as directed. Take 1 tablet 2 hours before your CT scan   naproxen sodium (ANAPROX) 550 MG tablet Take 550 mg by mouth 2 (two) times daily with a meal.   omeprazole (PRILOSEC) 20 MG capsule Take 20 mg by mouth daily as needed.   triamcinolone cream (KENALOG) 0.1 % Apply 1 application topically 2 (two) times daily as needed.     Allergies:   Ciprofloxacin, Metformin and related, Sulfa antibiotics, and Tagamet [cimetidine]   Social History   Socioeconomic History   Marital status: Divorced    Spouse name: Not on file   Number of children: 2   Years of education: Not on file   Highest education level: Not on file  Occupational History   Occupation: CASE WORKER  Tobacco Use   Smoking status: Never   Smokeless tobacco: Never  Vaping Use  Vaping Use: Never used  Substance and Sexual Activity   Alcohol use: Yes   Drug use: No   Sexual activity: Never    Partners: Male    Birth control/protection: None  Other Topics Concern   Not on file  Social History Narrative   Not on file   Social Determinants of Health   Financial Resource Strain: Not on file  Food Insecurity: Not on file  Transportation Needs: Not on file  Physical Activity: Not on file  Stress: Not on file  Social Connections: Not on file     Family History: The patient's family history includes Hypertension in her mother.  ROS:   Please see the history of present illness.     All other systems reviewed and are negative.  EKGs/Labs/Other Studies Reviewed:     The following studies were reviewed today: Prior office notes reviewed lab work reviewed  EKG:  The ekg ordered today demonstrates 09/01/2022 normal sinus rhythm 66 no other abnormalities  Recent Labs: No results found for requested labs within last 365 days.  Recent Lipid Panel No results found for: "CHOL", "TRIG", "HDL", "CHOLHDL", "VLDL", "LDLCALC", "LDLDIRECT"   Risk Assessment/Calculations:               Physical Exam:    VS:  BP 130/70 (BP Location: Left Arm, Patient Position: Sitting, Cuff Size: Normal)   Pulse 66   Ht 5\' 1"  (1.549 m)   Wt 204 lb (92.5 kg)   BMI 38.55 kg/m     Wt Readings from Last 3 Encounters:  09/01/22 204 lb (92.5 kg)  01/30/17 208 lb (94.3 kg)  07/30/12 221 lb (100.2 kg)     GEN:  Well nourished, well developed in no acute distress HEENT: Normal NECK: No JVD; No carotid bruits LYMPHATICS: No lymphadenopathy CARDIAC: RRR, no murmurs, rubs, gallops RESPIRATORY:  Clear to auscultation without rales, wheezing or rhonchi  ABDOMEN: Soft, non-tender, non-distended MUSCULOSKELETAL:  No edema; No deformity  SKIN: Warm and dry NEUROLOGIC:  Alert and oriented x 3 PSYCHIATRIC:  Normal affect   ASSESSMENT:    1. Exertional dyspnea   2. Chest pain with moderate risk for cardiac etiology   3. Type 2 diabetes mellitus with complication (HCC)    PLAN:    In order of problems listed above:  61 year old with diabetes with increased dyspnea on exertion, occasional palpitations.  Dyspnea on exertion - Given her diabetes which is a coronary artery disease equivalent, I want to make sure that this is not her anginal equivalent and we will go ahead and proceed with coronary CT scan with possible FFR analysis.  Heart rate is 66 bpm currently.  Last creatinine was 1.03 on 08/05/2022 from outside labs. - We will also check an echocardiogram to ensure proper structure and function of her heart.  1 to make sure that there is no evidence of cardiomyopathy  present. -If studies come back reassuring, continue with exercise efforts, conditioning efforts.  Palpitations - Rapid heartbeat, had to sit down after dancing 1 point.  Thankfully she has not had any recent episodes.  If this recurs, we will have low threshold for ZIO monitor at that time.  For now continue with conservative management strategies such as decreasing caffeine, daily exercise.          Medication Adjustments/Labs and Tests Ordered: Current medicines are reviewed at length with the patient today.  Concerns regarding medicines are outlined above.  Orders Placed This Encounter  Procedures   CT  CORONARY MORPH W/CTA COR W/SCORE W/CA W/CM &/OR WO/CM   EKG 12-Lead   ECHOCARDIOGRAM COMPLETE   Meds ordered this encounter  Medications   metoprolol tartrate (LOPRESSOR) 50 MG tablet    Sig: Take 1 tablet (50 mg total) by mouth as directed. Take 1 tablet 2 hours before your CT scan    Dispense:  1 tablet    Refill:  0    Patient Instructions  Medication Instructions:  The current medical regimen is effective;  continue present plan and medications.  *If you need a refill on your cardiac medications before your next appointment, please call your pharmacy*   Testing/Procedures: Your physician has requested that you have an echocardiogram. Echocardiography is a painless test that uses sound waves to create images of your heart. It provides your doctor with information about the size and shape of your heart and how well your heart's chambers and valves are working. This procedure takes approximately one hour. There are no restrictions for this procedure. Please do NOT wear cologne, perfume, aftershave, or lotions (deodorant is allowed). Please arrive 15 minutes prior to your appointment time.    Your cardiac CT will be scheduled at:   [facility] [address] [place], [place] [address] [phone]  Please arrive at the [facility] and [facility]'s Entrance  (Entrance C2) of [facility] 30 minutes prior to test start time. You can use the FREE valet parking offered at entrance C (encouraged to control the heart rate for the test)  Proceed to the [facility] Radiology Department (first floor) to check-in and test prep.  All radiology patients and guests should use entrance C2 at [facility], accessed from [facility], even though the hospital's physical address listed is [address]     Please follow these instructions carefully (unless otherwise directed):  On the Night Before the Test: Be sure to Drink plenty of water. Do not consume any caffeinated/decaffeinated beverages or chocolate 12 hours prior to your test. Do not take any antihistamines 12 hours prior to your test.  On the Day of the Test: Drink plenty of water until 1 hour prior to the test. Do not eat any food 1 hour prior to test. You may take your regular medications prior to the test.  Take metoprolol (Lopressor) two hours prior to test. HOLD Furosemide/Hydrochlorothiazide morning of the test. FEMALES- please wear underwire-free bra if available, avoid dresses & tight clothing  After the Test: Drink plenty of water. After receiving IV contrast, you may experience a mild flushed feeling. This is normal. On occasion, you may experience a mild rash up to 24 hours after the test. This is not dangerous. If this occurs, you can take Benadryl 25 mg and increase your fluid intake. If you experience trouble breathing, this can be serious. If it is severe call 911 IMMEDIATELY. If it is mild, please call our office. If you take any of these medications: Glipizide/Metformin, Avandament, Glucavance, please do not take 48 hours after completing test unless otherwise instructed.  We will call to schedule your test 2-4 weeks out understanding that some insurance companies will need an authorization prior to the service being performed.   For  non-scheduling related questions, please contact the cardiac imaging nurse navigator should you have any questions/concerns: Rockwell Alexandria, Cardiac Imaging Nurse Navigator Larey Brick, Cardiac Imaging Nurse Navigator [facility] Direct Office Dial: [phone]   For scheduling needs, including cancellations and rescheduling, please  call [place], [phone].   Follow-Up: At [facility], you and your health needs are our priority.  As part of our continuing mission to provide you with exceptional heart care, we have created designated Provider Care Teams.  These Care Teams include your primary Cardiologist (physician) and Advanced Practice Providers (APPs -  Physician Assistants and Nurse Practitioners) who all work together to provide you with the care you need, when you need it.  We recommend signing up for the patient portal called "MyChart".  Sign up information is provided on this After Visit Summary.  MyChart is used to connect with patients for Virtual Visits (Telemedicine).  Patients are able to view lab/test results, encounter notes, upcoming appointments, etc.  Non-urgent messages can be sent to your provider as well.   To learn more about what you can do with MyChart, go to [email or web address].    Your next appointment:   Follow up will be based on the results of the above testing.  Important Information About Sugar         Signed, Donato SchultzMark Kathy Wares, MD  09/01/2022 12:06 PM    [facility]

## 2022-09-23 ENCOUNTER — Telehealth (HOSPITAL_COMMUNITY): Payer: Self-pay | Admitting: Emergency Medicine

## 2022-09-23 NOTE — Telephone Encounter (Signed)
Attempted to call patient regarding upcoming cardiac CT appointment. °Left message on voicemail with name and callback number °Drena Ham RN Navigator Cardiac Imaging °[facility] °[phone] Office °[phone] Cell ° °

## 2022-09-27 ENCOUNTER — Telehealth (HOSPITAL_COMMUNITY): Payer: Self-pay | Admitting: Emergency Medicine

## 2022-09-27 NOTE — Telephone Encounter (Signed)
resending 50mg  metoprolol to pharm, new appt 10/04/22,

## 2022-09-28 ENCOUNTER — Encounter (HOSPITAL_COMMUNITY): Payer: Self-pay

## 2022-09-28 ENCOUNTER — Ambulatory Visit (HOSPITAL_COMMUNITY): Payer: 59

## 2022-09-28 ENCOUNTER — Ambulatory Visit (HOSPITAL_COMMUNITY)
Admission: RE | Admit: 2022-09-28 | Discharge: 2022-09-28 | Disposition: A | Discharge status: Home / Self Care | Payer: 59 | Source: Ambulatory Visit | Attending: Cardiology | Admitting: Cardiology

## 2022-09-28 VITALS — BP 150/71 | HR 80 | Resp 16

## 2022-09-28 DIAGNOSIS — E118 Type 2 diabetes mellitus with unspecified complications: Secondary | ICD-10-CM | POA: Diagnosis present

## 2022-09-28 DIAGNOSIS — R0609 Other forms of dyspnea: Secondary | ICD-10-CM

## 2022-09-28 DIAGNOSIS — R079 Chest pain, unspecified: Secondary | ICD-10-CM | POA: Diagnosis not present

## 2022-09-28 LAB — POCT I-STAT CREATININE: Creatinine, Ser: 1 mg/dL (ref 0.44–1.00)

## 2022-09-28 MED ORDER — NITROGLYCERIN 0.4 MG SL SUBL
SUBLINGUAL_TABLET | SUBLINGUAL | Status: AC
  Filled 2022-09-28: qty 2

## 2022-09-28 MED ORDER — NITROGLYCERIN 0.4 MG SL SUBL
0.8000 mg | SUBLINGUAL_TABLET | Freq: Once | SUBLINGUAL | Status: AC
  Administered 2022-09-28: 0.8 mg via SUBLINGUAL

## 2022-09-28 MED ORDER — IOHEXOL 350 MG/ML SOLN
95.0000 mL | Freq: Once | INTRAVENOUS | Status: AC | PRN
  Administered 2022-09-28: 95 mL via INTRAVENOUS

## 2022-09-28 NOTE — Progress Notes (Signed)
Patient stated she did not take the lopressor prior and would refuse any IV medication to lower her heart rate if needed.

## 2022-09-30 ENCOUNTER — Ambulatory Visit (HOSPITAL_COMMUNITY): Payer: 59 | Attending: Cardiology

## 2022-09-30 DIAGNOSIS — R0609 Other forms of dyspnea: Secondary | ICD-10-CM | POA: Diagnosis not present

## 2022-09-30 LAB — ECHOCARDIOGRAM COMPLETE
Area-P 1/2: 4.33 cm2
S' Lateral: 2.3 cm

## 2022-10-04 ENCOUNTER — Ambulatory Visit (HOSPITAL_COMMUNITY): Payer: 59

## 2023-01-22 IMAGING — MG MM DIGITAL SCREENING BILAT W/ TOMO AND CAD
6 of 10 series · 6 of 30 positions shown · non-contrast
Comparison: Previous exam(s).

CLINICAL DATA: Screening.

EXAM:
DIGITAL SCREENING BILATERAL MAMMOGRAM WITH TOMOSYNTHESIS AND CAD
TECHNIQUE: Bilateral screening digital craniocaudal and mediolateral oblique
mammograms were obtained. Bilateral screening digital breast
tomosynthesis was performed. The images were evaluated with
computer-aided detection.

[L CC synth-2D]
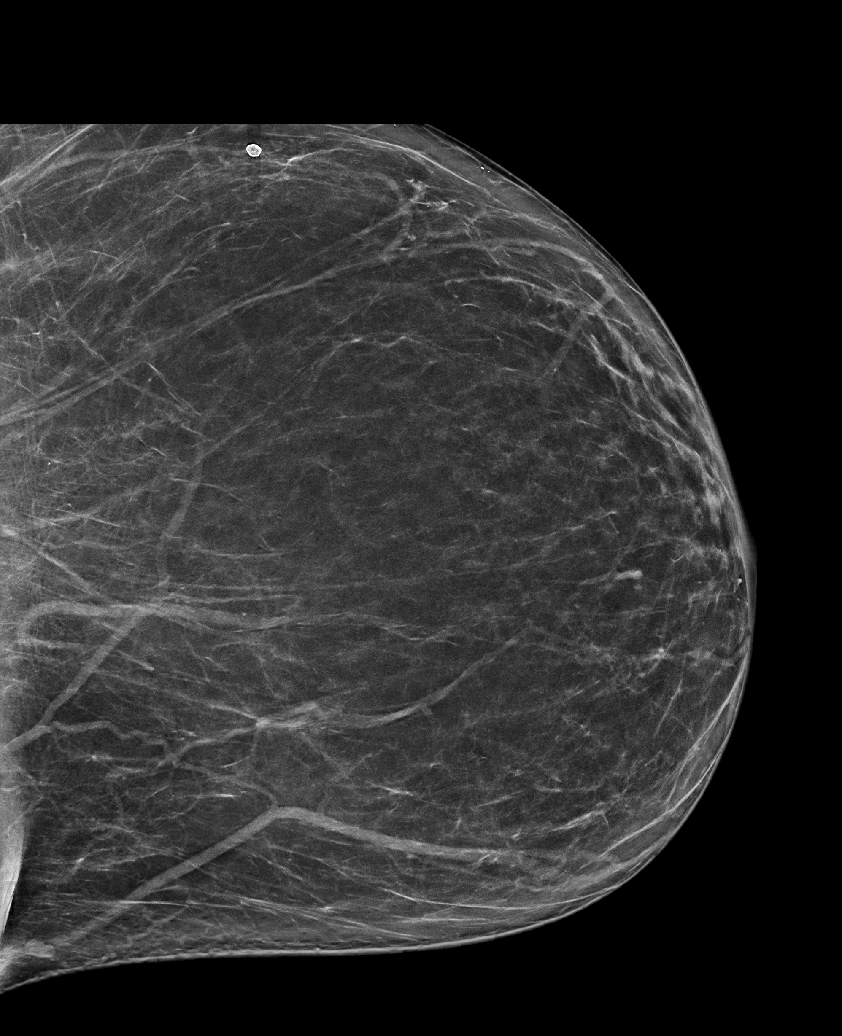

[R CC synth-2D (1 of 2)]
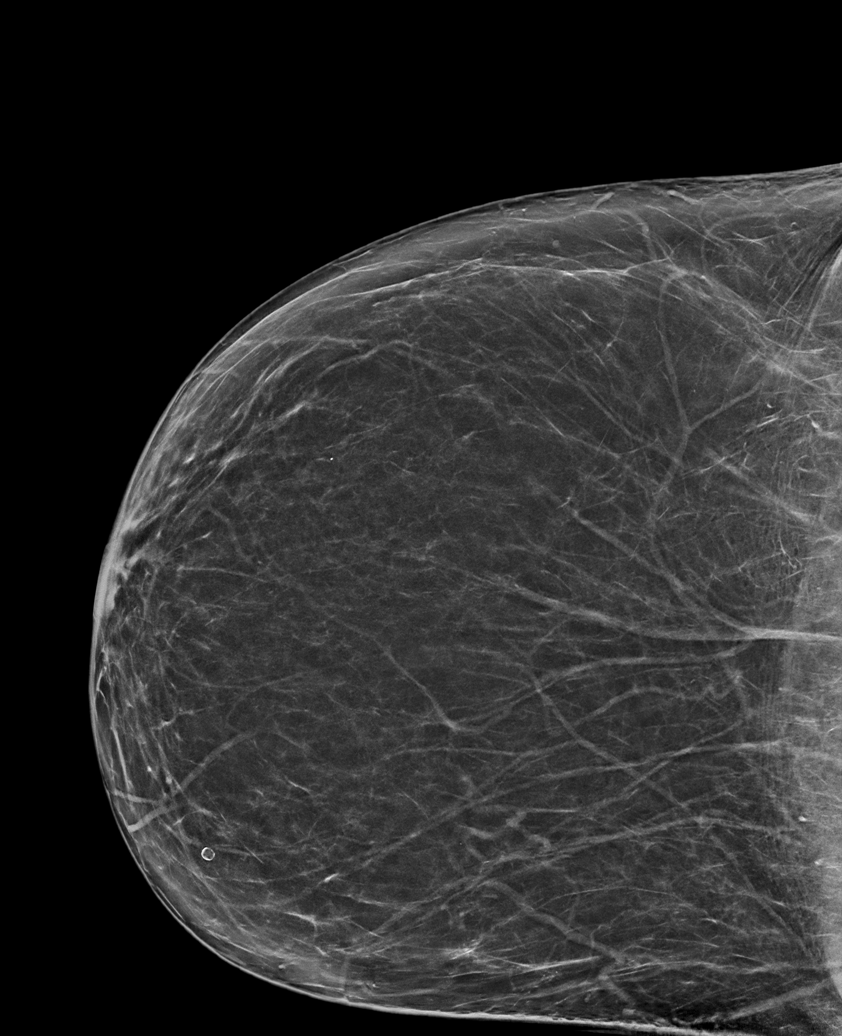

[R MLO synth-2D]
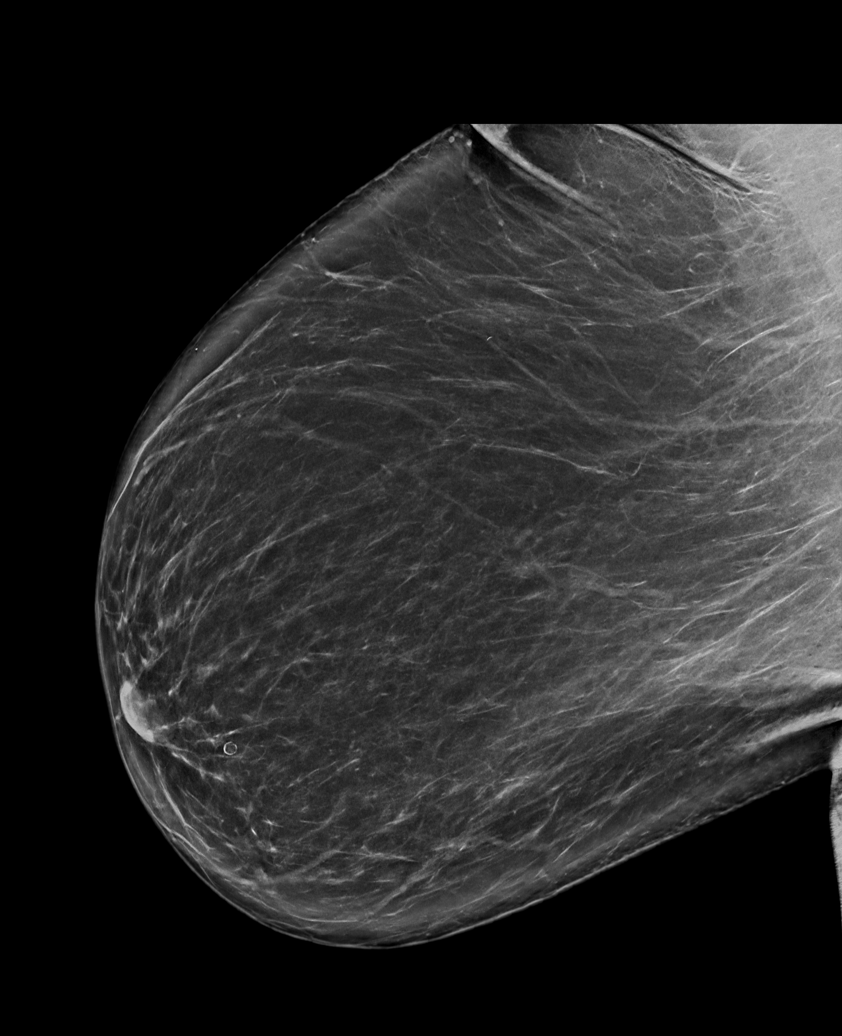

[R CC synth-2D (2 of 2)]
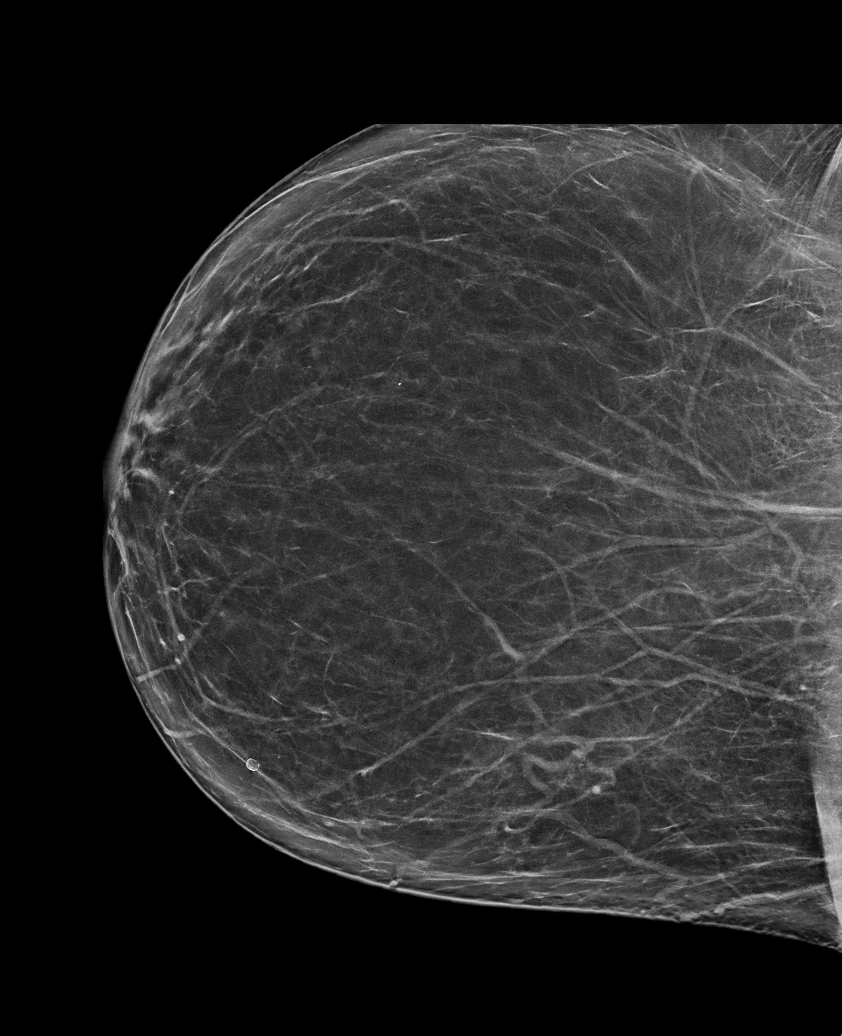

[L MLO synth-2D]
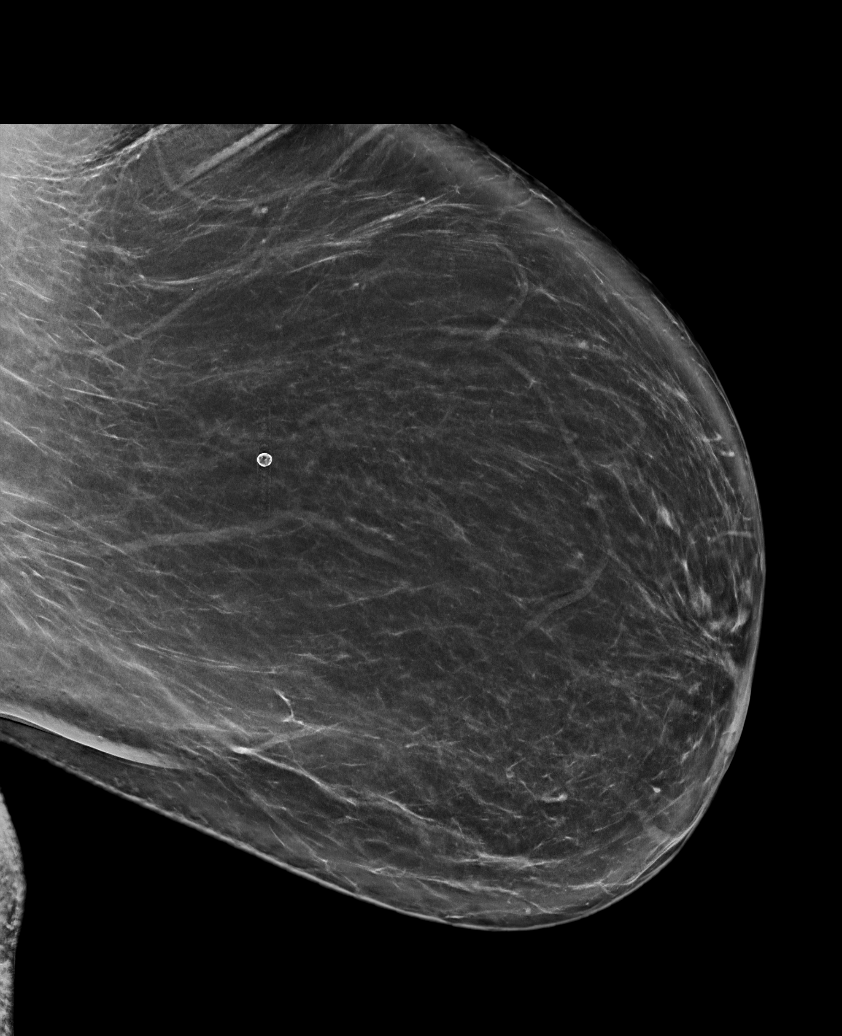

[L CC tomo · tomo slice 43/84.0]
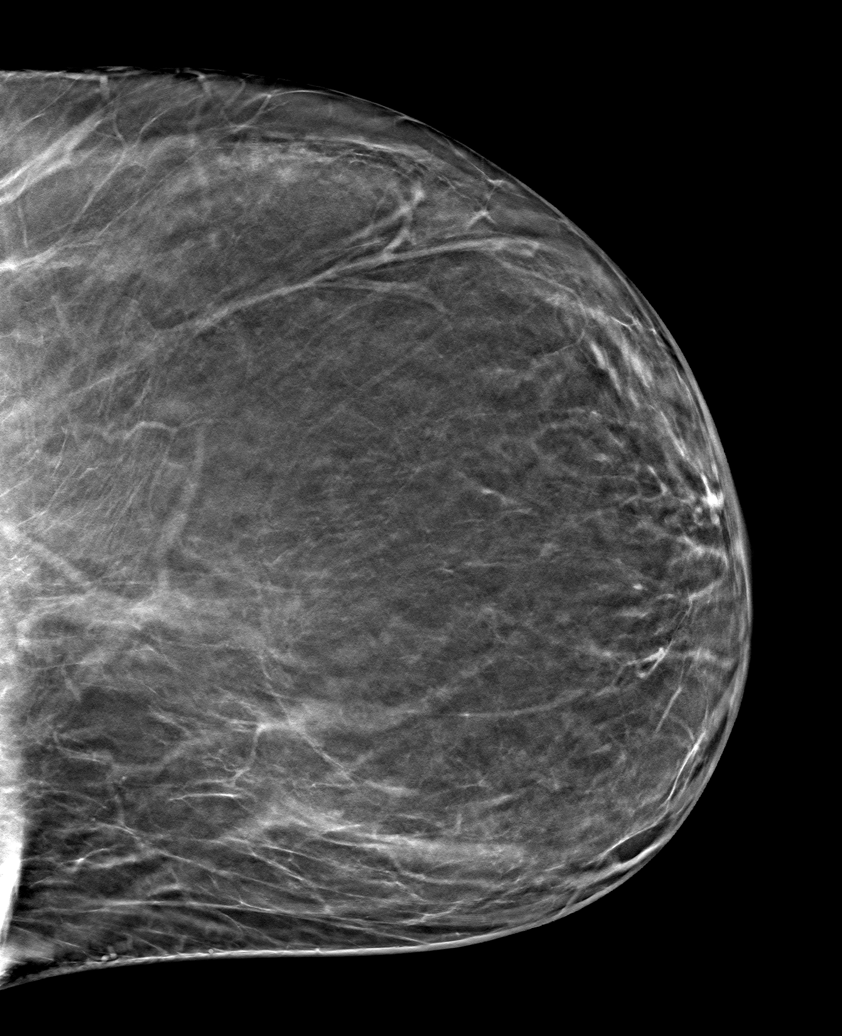

[6 of 30 positions shown; findings below may reference images not displayed]

ACR Breast Density Category b: There are scattered areas of
fibroglandular density.
FINDINGS: There are no findings suspicious for malignancy.
IMPRESSION: No mammographic evidence of malignancy. A result letter of this
screening mammogram will be mailed directly to the patient.

RECOMMENDATION:
Screening mammogram in one year. (Code:51-O-LD2)

BI-RADS CATEGORY  1: Negative.

## 2023-05-24 ENCOUNTER — Other Ambulatory Visit: Payer: Self-pay | Admitting: Family Medicine

## 2023-05-24 DIAGNOSIS — Z1231 Encounter for screening mammogram for malignant neoplasm of breast: Secondary | ICD-10-CM

## 2023-05-26 ENCOUNTER — Ambulatory Visit: Payer: 59

## 2023-08-15 ENCOUNTER — Ambulatory Visit
Admission: RE | Admit: 2023-08-15 | Discharge: 2023-08-15 | Disposition: A | Discharge status: Home / Self Care | Payer: 59 | Source: Ambulatory Visit | Attending: Family Medicine | Admitting: Family Medicine

## 2023-08-15 DIAGNOSIS — Z1231 Encounter for screening mammogram for malignant neoplasm of breast: Secondary | ICD-10-CM
